# Patient Record
Sex: Male | Born: 1970
Health system: Southern US, Community
[De-identification: ages and names within clinical notes are randomized; demographics above are authoritative.]

## PROBLEM LIST (undated history)

## (undated) DIAGNOSIS — K219 Gastro-esophageal reflux disease without esophagitis: Secondary | ICD-10-CM

## (undated) DIAGNOSIS — R490 Dysphonia: Secondary | ICD-10-CM

## (undated) HISTORY — PX: VASECTOMY: SHX75

## (undated) HISTORY — PX: MOUTH SURGERY: SHX715

---

## 1998-05-23 ENCOUNTER — Emergency Department (HOSPITAL_COMMUNITY): Admission: EM | Admit: 1998-05-23 | Discharge: 1998-05-23 | Payer: Self-pay | Admitting: Emergency Medicine

## 1998-06-17 ENCOUNTER — Emergency Department (HOSPITAL_COMMUNITY): Admission: EM | Admit: 1998-06-17 | Discharge: 1998-06-17 | Payer: Self-pay

## 1998-12-27 ENCOUNTER — Emergency Department (HOSPITAL_COMMUNITY): Admission: EM | Admit: 1998-12-27 | Discharge: 1998-12-27 | Payer: Self-pay | Admitting: *Deleted

## 2000-03-03 ENCOUNTER — Emergency Department (HOSPITAL_COMMUNITY): Admission: EM | Admit: 2000-03-03 | Discharge: 2000-03-03 | Payer: Self-pay

## 2000-08-27 ENCOUNTER — Emergency Department (HOSPITAL_COMMUNITY): Admission: EM | Admit: 2000-08-27 | Discharge: 2000-08-27 | Payer: Self-pay | Admitting: Emergency Medicine

## 2001-05-29 ENCOUNTER — Emergency Department (HOSPITAL_COMMUNITY): Admission: EM | Admit: 2001-05-29 | Discharge: 2001-05-29 | Payer: Self-pay | Admitting: *Deleted

## 2011-03-22 ENCOUNTER — Encounter: Payer: Self-pay | Admitting: *Deleted

## 2011-03-22 ENCOUNTER — Emergency Department (HOSPITAL_COMMUNITY)
Admission: EM | Admit: 2011-03-22 | Discharge: 2011-03-22 | Disposition: A | Discharge status: Home / Self Care | Payer: 59 | Attending: Emergency Medicine | Admitting: Emergency Medicine

## 2011-03-22 DIAGNOSIS — S21109A Unspecified open wound of unspecified front wall of thorax without penetration into thoracic cavity, initial encounter: Secondary | ICD-10-CM | POA: Insufficient documentation

## 2011-03-22 DIAGNOSIS — W268XXA Contact with other sharp object(s), not elsewhere classified, initial encounter: Secondary | ICD-10-CM | POA: Insufficient documentation

## 2011-03-22 DIAGNOSIS — IMO0002 Reserved for concepts with insufficient information to code with codable children: Secondary | ICD-10-CM

## 2011-03-22 MED ORDER — IBUPROFEN 800 MG PO TABS: 800.0000 mg | ORAL_TABLET | Freq: Three times a day (TID) | ORAL | Status: AC

## 2011-03-22 NOTE — ED Notes (Signed)
Pt states box cutter he was using a box cutter to cut a piece of aluminum when the box cutter slipped and cut his chest. Small laceration noted approximately 3 cm. Bleeding under control with guaze aplied

## 2011-03-22 NOTE — ED Notes (Signed)
J Idol PAC at bedside.  Dermabond applied to chest laceration after wound was cleansed with peroxide. Pt tolerated well.

## 2011-03-22 NOTE — ED Notes (Signed)
Pt has lac to chest. Pt states he cut himself with and box knife.

## 2011-03-23 NOTE — ED Provider Notes (Signed)
History     CSN: [id number] Arrival date & time: 03/22/2011  6:54 PM   First MD Initiated Contact with Patient 03/22/11 1915      Chief Complaint  Patient presents with  . Laceration    (Consider location/radiation/quality/duration/timing/severity/associated sxs/prior treatment) Patient is a 40 y.o. male presenting with skin laceration. The history is provided by the patient.  Laceration  The incident occurred 3 to 5 hours ago. The laceration is located on the chest. The laceration is 1 cm in size. Injury mechanism: He was trying to cut a piece of metal with a box cutter,  it slipped,  cutting his chest. The pain is at a severity of 0/10. The patient is experiencing no pain. The pain has been constant since onset. His tetanus status is UTD.    History reviewed. No pertinent past medical history.  History reviewed. No pertinent past surgical history.  History reviewed. No pertinent family history.  History  Substance Use Topics  . Smoking status: Never Smoker   . Smokeless tobacco: Not on file  . Alcohol Use: No      Review of Systems  Constitutional: Negative for fever.  HENT: Negative for congestion, sore throat and neck pain.   Eyes: Negative.   Respiratory: Negative for chest tightness and shortness of breath.   Cardiovascular: Negative for chest pain.  Gastrointestinal: Negative for nausea and abdominal pain.  Genitourinary: Negative.   Musculoskeletal: Negative for joint swelling and arthralgias.  Skin: Positive for wound. Negative for rash.  Neurological: Negative for dizziness, weakness, light-headedness, numbness and headaches.  Hematological: Negative.   Psychiatric/Behavioral: Negative.     Allergies  Review of patient's allergies indicates no known allergies.  Home Medications   Current Outpatient Rx  Name Route Sig Dispense Refill  . IBUPROFEN 800 MG PO TABS Oral Take 1 tablet (800 mg total) by mouth 3 (three) times daily. 21 tablet 0    BP  148/73  Pulse 101  Temp(Src) 98.8 F (37.1 C) (Oral)  Resp 22  Ht 5\' 9"  (1.753 m)  Wt 164 lb (74.39 kg)  BMI 24.22 kg/m2  SpO2 97%  Physical Exam  Nursing note and vitals reviewed. Constitutional: He is oriented to person, place, and time. He appears well-developed and well-nourished.  HENT:  Head: Normocephalic and atraumatic.  Eyes: Conjunctivae are normal.  Neck: Normal range of motion.  Cardiovascular: Normal rate, regular rhythm, normal heart sounds and intact distal pulses.   Pulmonary/Chest: Effort normal and breath sounds normal. He has no wheezes.       1 cm laceration mid sternal area,  Hemostatic.  Abdominal: Soft. Bowel sounds are normal. There is no tenderness.  Musculoskeletal: Normal range of motion.  Neurological: He is alert and oriented to person, place, and time.  Skin: Skin is warm and dry.  Psychiatric: He has a normal mood and affect.    ED Course  Procedures (including critical care time)  Labs Reviewed - No data to display No results found.   1. Laceration    LACERATION REPAIR Performed by: Candis Musa Authorized by: Candis Musa Consent: Verbal consent obtained. Risks and benefits: risks, benefits and alternatives were discussed Consent given by: patient Patient identity confirmed: provided demographic data Prepped and Draped in normal sterile fashion Wound explored  Laceration Location: chest  Laceration Length: 1 cm  No Foreign Bodies seen or palpated  Anesthesia: local infiltration  Local anesthetic: none   Anesthetic total:  na Irrigation method: syringe Amount of cleaning: standard using  peroxide  Skin closure: dermabond  Number of sutures:   Technique:   Patient tolerance: Patient tolerated the procedure well with no immediate complications.    MDM  Near approximated lac,  Hemostatic.        Candis Musa, PA 03/23/11 [phone]

## 2011-03-23 NOTE — ED Provider Notes (Signed)
Medical screening examination/treatment/procedure(s) were performed by non-physician practitioner and as supervising physician I was immediately available for consultation/collaboration.   Benny Lennert, MD 03/23/11 [phone]

## 2012-07-05 ENCOUNTER — Ambulatory Visit (INDEPENDENT_AMBULATORY_CARE_PROVIDER_SITE_OTHER): Payer: 59 | Admitting: Otolaryngology

## 2012-07-05 DIAGNOSIS — R490 Dysphonia: Secondary | ICD-10-CM

## 2012-07-05 DIAGNOSIS — J381 Polyp of vocal cord and larynx: Secondary | ICD-10-CM

## 2012-07-09 ENCOUNTER — Encounter (HOSPITAL_BASED_OUTPATIENT_CLINIC_OR_DEPARTMENT_OTHER): Payer: Self-pay | Admitting: *Deleted

## 2012-07-10 ENCOUNTER — Ambulatory Visit (HOSPITAL_BASED_OUTPATIENT_CLINIC_OR_DEPARTMENT_OTHER)
Admission: RE | Admit: 2012-07-10 | Discharge: 2012-07-10 | Disposition: A | Discharge status: Home / Self Care | Payer: 59 | Source: Ambulatory Visit | Attending: Otolaryngology | Admitting: Otolaryngology

## 2012-07-10 ENCOUNTER — Encounter (HOSPITAL_BASED_OUTPATIENT_CLINIC_OR_DEPARTMENT_OTHER)
Admission: RE | Disposition: A | Discharge status: Home / Self Care | Payer: Self-pay | Source: Ambulatory Visit | Attending: Otolaryngology

## 2012-07-10 ENCOUNTER — Encounter (HOSPITAL_BASED_OUTPATIENT_CLINIC_OR_DEPARTMENT_OTHER): Payer: Self-pay | Admitting: Anesthesiology

## 2012-07-10 ENCOUNTER — Ambulatory Visit (HOSPITAL_BASED_OUTPATIENT_CLINIC_OR_DEPARTMENT_OTHER): Payer: 59 | Admitting: Anesthesiology

## 2012-07-10 DIAGNOSIS — K219 Gastro-esophageal reflux disease without esophagitis: Secondary | ICD-10-CM | POA: Insufficient documentation

## 2012-07-10 DIAGNOSIS — J381 Polyp of vocal cord and larynx: Secondary | ICD-10-CM | POA: Insufficient documentation

## 2012-07-10 DIAGNOSIS — Z8711 Personal history of peptic ulcer disease: Secondary | ICD-10-CM | POA: Insufficient documentation

## 2012-07-10 DIAGNOSIS — Z79899 Other long term (current) drug therapy: Secondary | ICD-10-CM | POA: Insufficient documentation

## 2012-07-10 HISTORY — DX: Dysphonia: R49.0

## 2012-07-10 HISTORY — PX: DIRECT LARYNGOSCOPY: SHX5326

## 2012-07-10 HISTORY — DX: Gastro-esophageal reflux disease without esophagitis: K21.9

## 2012-07-10 LAB — POCT HEMOGLOBIN-HEMACUE: Hemoglobin: 15.2 g/dL (ref 13.0–17.0)

## 2012-07-10 SURGERY — LARYNGOSCOPY, DIRECT
Anesthesia: General | Site: Throat | Laterality: Right | Wound class: Clean Contaminated

## 2012-07-10 MED ORDER — ONDANSETRON HCL 4 MG/2ML IJ SOLN
INTRAMUSCULAR | Status: DC | PRN
  Administered 2012-07-10: 4 mg via INTRAVENOUS

## 2012-07-10 MED ORDER — DEXAMETHASONE SODIUM PHOSPHATE 4 MG/ML IJ SOLN
INTRAMUSCULAR | Status: DC | PRN
  Administered 2012-07-10: 10 mg via INTRAVENOUS

## 2012-07-10 MED ORDER — SUCCINYLCHOLINE CHLORIDE 20 MG/ML IJ SOLN
INTRAMUSCULAR | Status: DC | PRN
  Administered 2012-07-10: 100 mg via INTRAVENOUS

## 2012-07-10 MED ORDER — OXYCODONE HCL 5 MG/5ML PO SOLN: 5.0000 mg | Freq: Once | ORAL | Status: DC | PRN

## 2012-07-10 MED ORDER — FENTANYL CITRATE 0.05 MG/ML IJ SOLN: 50.0000 ug | INTRAMUSCULAR | Status: DC | PRN

## 2012-07-10 MED ORDER — LIDOCAINE HCL (CARDIAC) 20 MG/ML IV SOLN
INTRAVENOUS | Status: DC | PRN
  Administered 2012-07-10: 100 mg via INTRAVENOUS

## 2012-07-10 MED ORDER — FENTANYL CITRATE 0.05 MG/ML IJ SOLN
INTRAMUSCULAR | Status: DC | PRN
  Administered 2012-07-10: 100 ug via INTRAVENOUS

## 2012-07-10 MED ORDER — MIDAZOLAM HCL 5 MG/5ML IJ SOLN
INTRAMUSCULAR | Status: DC | PRN
  Administered 2012-07-10: 2 mg via INTRAVENOUS

## 2012-07-10 MED ORDER — HYDROMORPHONE HCL PF 1 MG/ML IJ SOLN: 0.2500 mg | INTRAMUSCULAR | Status: DC | PRN

## 2012-07-10 MED ORDER — HYDROCODONE-ACETAMINOPHEN 5-325 MG PO TABS: 1.0000 | ORAL_TABLET | ORAL | Status: DC | PRN

## 2012-07-10 MED ORDER — OXYCODONE HCL 5 MG PO TABS: 5.0000 mg | ORAL_TABLET | Freq: Once | ORAL | Status: DC | PRN

## 2012-07-10 MED ORDER — LACTATED RINGERS IV SOLN
INTRAVENOUS | Status: DC
  Administered 2012-07-10: 09:00:00 via INTRAVENOUS

## 2012-07-10 MED ORDER — MIDAZOLAM HCL 2 MG/2ML IJ SOLN: 1.0000 mg | INTRAMUSCULAR | Status: DC | PRN

## 2012-07-10 MED ORDER — PROPOFOL 10 MG/ML IV BOLUS
INTRAVENOUS | Status: DC | PRN
  Administered 2012-07-10: 200 mg via INTRAVENOUS

## 2012-07-10 MED ORDER — EPINEPHRINE HCL 1 MG/ML IJ SOLN
INTRAMUSCULAR | Status: DC | PRN
  Administered 2012-07-10: 1 mg

## 2012-07-10 SURGICAL SUPPLY — 20 items
CANISTER SUCTION 1200CC (MISCELLANEOUS) ×2 IMPLANT
CLOTH BEACON ORANGE TIMEOUT ST (SAFETY) ×2 IMPLANT
GAUZE SPONGE 4X4 12PLY STRL LF (GAUZE/BANDAGES/DRESSINGS) ×2 IMPLANT
GLOVE BIO SURGEON STRL SZ7 (GLOVE) ×2 IMPLANT
GLOVE BIO SURGEON STRL SZ7.5 (GLOVE) ×2 IMPLANT
GOWN PREVENTION PLUS XLARGE (GOWN DISPOSABLE) IMPLANT
GOWN PREVENTION PLUS XXLARGE (GOWN DISPOSABLE) ×2 IMPLANT
GUARD TEETH (MISCELLANEOUS) IMPLANT
MARKER SKIN DUAL TIP RULER LAB (MISCELLANEOUS) IMPLANT
NEEDLE SPNL 22GX7 QUINCKE BK (NEEDLE) IMPLANT
NEEDLE SPNL 25GX3.5 QUINCKE BL (NEEDLE) IMPLANT
NS IRRIG 1000ML POUR BTL (IV SOLUTION) ×2 IMPLANT
PATTIES SURGICAL .5 X3 (DISPOSABLE) ×2 IMPLANT
SHEET MEDIUM DRAPE 40X70 STRL (DRAPES) ×2 IMPLANT
SLEEVE SCD COMPRESS KNEE MED (MISCELLANEOUS) IMPLANT
SOLUTION BUTLER CLEAR DIP (MISCELLANEOUS) ×2 IMPLANT
SYR CONTROL 10ML LL (SYRINGE) IMPLANT
SYR TB 1ML LL NO SAFETY (SYRINGE) IMPLANT
TOWEL OR 17X24 6PK STRL BLUE (TOWEL DISPOSABLE) ×2 IMPLANT
TUBE CONNECTING 20X1/4 (TUBING) ×2 IMPLANT

## 2012-07-10 NOTE — Anesthesia Postprocedure Evaluation (Signed)
  Anesthesia Post-op Note  Patient: Joshua Mathis  Procedure(s) Performed: Procedure(s): DIRECT LARYNGOSCOPY WITH BIOPSY (Right)  Patient Location: PACU  Anesthesia Type:General  Level of Consciousness: awake and alert   Airway and Oxygen Therapy: Patient Spontanous Breathing  Post-op Pain: none  Post-op Assessment: Post-op Vital signs reviewed, Patient's Cardiovascular Status Stable, Respiratory Function Stable, Patent Airway and No signs of Nausea or vomiting  Post-op Vital Signs: Reviewed and stable  Complications: No apparent anesthesia complications

## 2012-07-10 NOTE — Brief Op Note (Signed)
07/10/2012  10:28 AM  PATIENT:  Joshua Mathis  42 y.o. male  PRE-OPERATIVE DIAGNOSIS:  RIGHT FUNGATING LESION OF VOCAL CORD  POST-OPERATIVE DIAGNOSIS:  RIGHT FUNGATING LESION OF VOCAL CORD  PROCEDURE:  Procedure(s): MICRO DIRECT LARYNGOSCOPY WITH EXCISION OF RIGHT VOCAL CORD MASS  SURGEON:  Surgeon(s) and Role:    * Sui W Teoh, MD - Primary  PHYSICIAN ASSISTANT:   ASSISTANTS: none   ANESTHESIA:   general  EBL:  Total I/O In: 500 [I.V.:500] Out: -   BLOOD ADMINISTERED:none  DRAINS: none   LOCAL MEDICATIONS USED:  OTHER Topical epinephrine  SPECIMEN:  Source of Specimen:  Right vocal cord mass  DISPOSITION OF SPECIMEN:  PATHOLOGY  COUNTS:  YES  TOURNIQUET:  * No tourniquets in log *  DICTATION: .Other Dictation: Dictation Number [id number]  PLAN OF CARE: Discharge to home after PACU  PATIENT DISPOSITION:  PACU - hemodynamically stable.   Delay start of Pharmacological VTE agent (>24hrs) due to surgical blood loss or risk of bleeding: not applicable

## 2012-07-10 NOTE — Anesthesia Preprocedure Evaluation (Addendum)
Anesthesia Evaluation  Patient identified by MRN, date of birth, ID band Patient awake    Reviewed: Allergy & Precautions, H&P , NPO status , Patient's Chart, lab work & pertinent test results  Airway Mallampati: II TM Distance: >3 FB Neck ROM: Full    Dental no notable dental hx. (+) Teeth Intact and Dental Advisory Given   Pulmonary neg pulmonary ROS,  breath sounds clear to auscultation  Pulmonary exam normal       Cardiovascular negative cardio ROS  Rhythm:Regular Rate:Normal     Neuro/Psych negative neurological ROS  negative psych ROS   GI/Hepatic Neg liver ROS, GERD-  Medicated,  Endo/Other  negative endocrine ROS  Renal/GU negative Renal ROS  negative genitourinary   Musculoskeletal   Abdominal   Peds  Hematology negative hematology ROS (+)   Anesthesia Other Findings   Reproductive/Obstetrics negative OB ROS                          Anesthesia Physical Anesthesia Plan  ASA: II  Anesthesia Plan: General   Post-op Pain Management:    Induction: Intravenous  Airway Management Planned: Oral ETT  Additional Equipment:   Intra-op Plan:   Post-operative Plan: Extubation in OR  Informed Consent: I have reviewed the patients History and Physical, chart, labs and discussed the procedure including the risks, benefits and alternatives for the proposed anesthesia with the patient or authorized representative who has indicated his/her understanding and acceptance.   Dental advisory given  Plan Discussed with: CRNA  Anesthesia Plan Comments:         Anesthesia Quick Evaluation

## 2012-07-10 NOTE — Anesthesia Procedure Notes (Signed)
Procedure Name: Intubation Date/Time: 07/10/2012 10:02 AM Performed by: Caren Macadam Pre-anesthesia Checklist: Patient identified, Emergency Drugs available, Suction available and Patient being monitored Patient Re-evaluated:Patient Re-evaluated prior to inductionOxygen Delivery Method: Circle System Utilized Preoxygenation: Pre-oxygenation with 100% oxygen Intubation Type: IV induction Ventilation: Mask ventilation without difficulty Laryngoscope Size: Miller and 2 Grade View: Grade I Tube type: Oral Tube size: 6.5 mm Number of attempts: 1 Airway Equipment and Method: stylet and oral airway Placement Confirmation: ETT inserted through vocal cords under direct vision,  positive ETCO2 and breath sounds checked- equal and bilateral Secured at: 24 cm Tube secured with: Tape Dental Injury: Teeth and Oropharynx as per pre-operative assessment

## 2012-07-10 NOTE — H&P (Signed)
  H&P Update  Pt's original H&P dated 07/05/12 reviewed and placed in chart (to be scanned).  I personally examined the patient today.  No change in health. Proceed with micro direct laryngoscopy with biopsy.

## 2012-07-10 NOTE — Transfer of Care (Signed)
Immediate Anesthesia Transfer of Care Note  Patient: Joshua Mathis  Procedure(s) Performed: Procedure(s): DIRECT LARYNGOSCOPY WITH BIOPSY (Right)  Patient Location: PACU  Anesthesia Type:General  Level of Consciousness: awake and alert   Airway & Oxygen Therapy: Patient Spontanous Breathing and Patient connected to face mask oxygen  Post-op Assessment: Report given to PACU RN and Post -op Vital signs reviewed and stable  Post vital signs: Reviewed and stable  Complications: No apparent anesthesia complications

## 2012-07-11 ENCOUNTER — Encounter (HOSPITAL_BASED_OUTPATIENT_CLINIC_OR_DEPARTMENT_OTHER): Payer: Self-pay | Admitting: Otolaryngology

## 2012-07-11 NOTE — Op Note (Signed)
NAMEJERRAN, Joshua Mathis NO.:  [id number]  MEDICAL RECORD NO.:  [id number]  LOCATION:                                 FACILITY:  PHYSICIAN:  Newman Pies, MD                 DATE OF BIRTH:  DATE OF PROCEDURE:  07/10/2012 DATE OF DISCHARGE:                              OPERATIVE REPORT   SURGEON:  Newman Pies, MD  PREOPERATIVE DIAGNOSES: 1. Hoarseness. 2. Right vocal cord mass.  POSTOPERATIVE DIAGNOSES: 1. Hoarseness. 2. Right vocal cord mass.  PROCEDURE PERFORMED:  Microdirect laryngoscopy with excision of right vocal cord mass.  ANESTHESIA:  General endotracheal tube anesthesia.  COMPLICATIONS:  None.  ESTIMATED BLOOD LOSS:  Minimal.  INDICATION FOR PROCEDURE:  The patient is a 42 year old male with a history of hoarseness for several months.  On examination, he was noted to have right anterior vocal cord mass.  Based on the physical exam and laryngoscopy findings, the decision was made for the patient to undergo excision of the right vocal cord mass.  The risks, benefits, alternatives, and details of the procedure were discussed with the patient.  Questions were invited and answered.  Informed consent was obtained.  DESCRIPTION:  The patient was taken to the operating room and placed supine on the operating table.  General endotracheal tube anesthesia was administered by the anesthesiologist.  Preop IV Decadron was given.  The patient was positioned and prepped and draped in a standard fashion for direct laryngoscopy.  Dedo laryngoscope was inserted via the oral cavity into the pharynx.  Examination of the vallecula, epiglottis, and piriform sinuses were all normal.  Examination of the vocal cords revealed 3 mm right anterior vocal cord mass.  Photo documentation of the findings were obtained.  The Dedo laryngoscope was suspended with a Lewy suspender.  Using microscope, the right vocal cord mass was resected with a combination of laryngeal forceps and  laryngeal scissors. The specimen was sent to the Pathology Department for permanent histologic identification.  Hemostasis was achieved with pledgets soaked with epinephrine.  The laryngoscope was withdrawn.  The care of the patient was turned over to the anesthesiologist.  The patient was awakened from anesthesia without difficulty.  He was extubated and transferred to the recovery room in good condition.  OPERATIVE FINDINGS:  A 3-mm right anterior vocal cord mass was noted. It was removed without difficulty.  SPECIMEN:  Right vocal cord mass.  FOLLOWUP CARE:  The patient will be discharged home once he is awake and alert.  He may take Vicodin 1 tablet p.o. q.4 hours p.r.n. pain.  He is also instructed to continue his Nexium treatment regimen.  The patient will follow up in my office in 1 week.     Newman Pies, MD     ST/MEDQ  D:  07/10/2012  T:  07/11/2012  Job:  [id number]  cc:   Catalina Pizza, M.D.

## 2012-07-19 ENCOUNTER — Ambulatory Visit (INDEPENDENT_AMBULATORY_CARE_PROVIDER_SITE_OTHER): Payer: 59 | Admitting: Otolaryngology

## 2012-07-19 DIAGNOSIS — J381 Polyp of vocal cord and larynx: Secondary | ICD-10-CM

## 2012-07-19 DIAGNOSIS — R490 Dysphonia: Secondary | ICD-10-CM

## 2012-10-11 ENCOUNTER — Ambulatory Visit (INDEPENDENT_AMBULATORY_CARE_PROVIDER_SITE_OTHER): Payer: 59 | Admitting: Otolaryngology

## 2012-10-18 ENCOUNTER — Ambulatory Visit (INDEPENDENT_AMBULATORY_CARE_PROVIDER_SITE_OTHER): Payer: 59 | Admitting: Otolaryngology

## 2012-10-18 DIAGNOSIS — R490 Dysphonia: Secondary | ICD-10-CM

## 2012-10-18 DIAGNOSIS — J381 Polyp of vocal cord and larynx: Secondary | ICD-10-CM

## 2013-04-11 ENCOUNTER — Ambulatory Visit (INDEPENDENT_AMBULATORY_CARE_PROVIDER_SITE_OTHER): Payer: 59 | Admitting: Otolaryngology

## 2013-04-11 ENCOUNTER — Encounter (INDEPENDENT_AMBULATORY_CARE_PROVIDER_SITE_OTHER): Payer: Self-pay

## 2013-04-11 DIAGNOSIS — R490 Dysphonia: Secondary | ICD-10-CM

## 2013-04-11 DIAGNOSIS — J381 Polyp of vocal cord and larynx: Secondary | ICD-10-CM

## 2013-10-24 ENCOUNTER — Ambulatory Visit (INDEPENDENT_AMBULATORY_CARE_PROVIDER_SITE_OTHER): Payer: 59 | Admitting: Otolaryngology

## 2013-10-24 DIAGNOSIS — K219 Gastro-esophageal reflux disease without esophagitis: Secondary | ICD-10-CM

## 2013-10-24 DIAGNOSIS — R490 Dysphonia: Secondary | ICD-10-CM

## 2013-10-24 DIAGNOSIS — J381 Polyp of vocal cord and larynx: Secondary | ICD-10-CM

## 2013-11-02 ENCOUNTER — Encounter (HOSPITAL_COMMUNITY): Payer: Self-pay | Admitting: Emergency Medicine

## 2013-11-02 ENCOUNTER — Emergency Department (HOSPITAL_COMMUNITY)
Admission: EM | Admit: 2013-11-02 | Discharge: 2013-11-02 | Disposition: A | Discharge status: Home / Self Care | Payer: 59 | Source: Home / Self Care | Attending: Emergency Medicine | Admitting: Emergency Medicine

## 2013-11-02 DIAGNOSIS — M7652 Patellar tendinitis, left knee: Secondary | ICD-10-CM

## 2013-11-02 DIAGNOSIS — M7650 Patellar tendinitis, unspecified knee: Secondary | ICD-10-CM

## 2013-11-02 MED ORDER — KETOROLAC TROMETHAMINE 60 MG/2ML IM SOLN
60.0000 mg | Freq: Once | INTRAMUSCULAR | Status: AC
  Administered 2013-11-02: 60 mg via INTRAMUSCULAR

## 2013-11-02 MED ORDER — KETOROLAC TROMETHAMINE 60 MG/2ML IM SOLN
INTRAMUSCULAR | Status: AC
  Filled 2013-11-02: qty 2

## 2013-11-02 MED ORDER — INDOMETHACIN 25 MG PO CAPS: 25.0000 mg | ORAL_CAPSULE | Freq: Three times a day (TID) | ORAL | Status: DC

## 2013-11-02 NOTE — ED Provider Notes (Signed)
CSN: [id number]     Arrival date & time 11/02/13  0915 History   First MD Initiated Contact with Patient 11/02/13 [phone]     Chief Complaint  Patient presents with  . 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 States he has had progressive swelling, pain and redness of left anterior knee over past 24 hours. No hx of gout No fever/chills PCP: [place] Margo [place]Hall in [place] States he took ibuprofen yesterday evening with relief.    Patient is a 43 y.o. male presenting with knee pain. The history is provided by the patient.  Knee Pain Location:  Knee Time since incident:  1 day Injury: no   Knee location:  L knee Pain details:    Quality:  Throbbing Chronicity:  New Dislocation: no   Associated symptoms: muscle weakness and swelling   Associated symptoms: no decreased ROM and no fever     Past Medical History  Diagnosis Date  . Hoarseness   . GERD (gastroesophageal reflux disease)    Past Surgical History  Procedure Laterality Date  . Mouth surgery      cyst jaw as teen  . Direct laryngoscopy Right 07/10/2012    Procedure: DIRECT LARYNGOSCOPY WITH BIOPSY;  Surgeon: Darletta MollSui W Teoh, MD;  Location: [facility];  Service: ENT;  Laterality: Right;   No family history on file. History  Substance Use Topics  . Smoking status: Former Smoker    Quit date: 07/10/1998  . Smokeless tobacco: Not on file  . Alcohol Use: No    Review of Systems  Constitutional: Negative for fever.  All other systems reviewed and are negative.   Allergies  Review of patient's allergies indicates no known allergies.  Home Medications   Prior to Admission medications   Medication Sig Start Date End Date Taking? Authorizing Provider   ibuprofen (ADVIL,MOTRIN) 200 MG tablet Take 200 mg by mouth every 6 (six) hours as needed.   Yes Historical Provider, MD  HYDROcodone-acetaminophen (NORCO/VICODIN) 5-325 MG per tablet Take 1 tablet by mouth every 4 (four) hours as needed for pain. 07/10/12   Darletta MollSui W Teoh, MD  indomethacin (INDOCIN) 25 MG capsule Take 1 capsule (25 mg total) by mouth 3 (three) times daily with meals. X 7 days 11/02/13   Jess BartersJennifer Lee Presson, PA  omeprazole (PRILOSEC) 20 MG capsule Take 20 mg by mouth daily.    Historical Provider, MD   BP 109/75  Pulse 57  Temp(Src) 98.1 F (36.7 C) (Oral)  Resp 16  SpO2 98% Physical Exam  Nursing note and vitals reviewed. Constitutional: He is oriented to person, place, and time. He appears well-developed and well-nourished. No distress.  HENT:  Head: Normocephalic and atraumatic.  Eyes: Conjunctivae are normal. No scleral icterus.  Cardiovascular: Normal rate.   Pulmonary/Chest: Effort normal.  Musculoskeletal:       Left knee: He exhibits swelling, effusion and erythema. He exhibits normal range of motion, no ecchymosis, no deformity, no laceration, normal alignment, no LCL laxity, normal patellar mobility and no bony tenderness. Tenderness found. Patellar tendon tenderness noted.  Moderate erythema and tenderness with joint effusion of left anterior knee. +palpable rub or mild crepitance of patellar tendon with palpation   Neurological: He is alert and oriented to person, place, and time.  Skin:  Skin is warm and dry.  Psychiatric: He has a normal mood and affect. His behavior is normal.    ED Course  Procedures (including critical care time) Labs Review Labs Reviewed - No data to display  Imaging Review No results found.   MDM   1. Patellar tendonitis of left knee   I suspect patient has overuse condition or patellar tendonitis as a result of his work. Low clinical suspicion of gout or septic joint. Will advise RICE therapy and indocin as prescribed for next 7  days and follow up if no improvement. Patient given ice pack and Toradol 60mg  [facility]   Jess BartersJennifer Lee WellsPresson, [place] 11/02/13 [address]1016  Jennifer Lee AgraPresson, [place] 11/02/13 1016

## 2013-11-02 NOTE — ED Notes (Signed)
Called script for medication to pharmacy of patients choice[facility].

## 2013-11-02 NOTE — ED Notes (Signed)
Left knee pain, redness, and swelling.  No known injury.  Onset yesterday afternoon

## 2013-11-02 NOTE — Discharge Instructions (Signed)
Patellar Tendinitis, Jumper's Knee with Rehab Tendinitis is inflammation of a tendon. Tendonitis of the tendon below the kneecap (patella) is known as patellar tendonitis. Patellar tendonitis is a common cause of pain below the kneecap (infrapatella). Patellar tendonitis may involve a tear (strain) in the ligament. Strains are classified into three categories. Grade 1 strains cause pain, but the tendon is not lengthened. Grade 2 strains include a lengthened ligament, due to the ligament being stretched or partially ruptured. With grade 2 strains there is still function, although function may be decreased. Grade 3 strains involve a complete tear of the tendon or muscle, and function is usually impaired. Patellar tendon strains are usually grade 1 or 2.  SYMPTOMS   Pain, tenderness, swelling, warmth, or redness over the patellar tendon (just below the kneecap).  Pain and loss of strength (sometimes), with forcefully straightening the knee (especially when jumping or rising from a seated or squatting position), or bending the knee completely (squatting or kneeling).  Crackling sound (crepitation) when the tendon is moved or touched. CAUSES  Patellar tendonitis is caused by injury to the patellar tendon. The inflammation is the body's healing response. Common causes of injury include:  Stress from a sudden increase in intensity, frequency, or duration of training.  Overuse of the quadriceps thigh muscles and patellar tendon.  Direct hit (trauma) to the knee or patellar tendon. RISK INCREASES WITH:  Sports that require sudden, explosive thigh muscle (quadriceps) contraction, such as jumping, quick starts, or kicking.  Running sports, especially running down hills.  Poor strength and flexibility of the thigh and knee.  Flat feet. PREVENTION  Warm up and stretch properly before activity.  Allow for adequate recovery between workouts.  Maintain physical fitness:  Strength, flexibility, and  endurance.  Cardiovascular fitness.  Protect the knee joint with taping, protective strapping, bracing, or elastic compression bandage.  Wear arch supports (orthotics). PROGNOSIS  If treated properly, patellar tendonitis usually heals within 6 weeks.  RELATED COMPLICATIONS   Longer healing time, if not properly treated or if not given enough time to heal.  Recurring symptoms, if activity is resumed too soon, with overuse, with a direct blow, or when using poor technique.  If untreated, tendon rupture requiring surgery. TREATMENT Treatment first involves the use of ice and medicine, to reduce pain and inflammation. The use of strengthening and stretching exercises may help reduce pain with activity. These exercises may be performed at home or with a therapist. Serious cases of tendonitis may require restraining the knee for 10 to 14 days, to prevent stress on the tendon and to promote healing. Crutches may be used (uncommon) until you can walk without a limp. For cases in which non-surgical treatment is unsuccessful, surgery may be advised, to remove the inflamed tendon lining (sheath). Surgery is rare, and is only advised after at least 6 months of non-surgical treatment. MEDICATION   If pain medicine is needed, nonsteroidal anti-inflammatory medicines (aspirin and ibuprofen), or other minor pain relievers (acetaminophen), are often advised.  Do not take pain medicine for 7 days before surgery.  Prescription pain relievers may be given, if your caregiver thinks they are needed. Use only as directed and only as much as you need. HEAT AND COLD  Cold treatment (icing) should be applied for 10 to 15 minutes every 2 to 3 hours for inflammation and pain, and immediately after activity that aggravates your symptoms. Use ice packs or an ice massage.  Heat treatment may be used before performing stretching and  strengthening activities prescribed by your caregiver, physical therapist, or athletic  trainer. Use a heat pack or a warm water soak. SEEK MEDICAL CARE IF:  Symptoms get worse or do not improve in 2 weeks, despite treatment.  New, unexplained symptoms develop. (Drugs used in treatment may produce side effects.) EXERCISES RANGE OF MOTION (ROM) AND STRETCHING EXERCISES - Patellar Tendinitis (Jumper's Knee) These are some of the initial exercises with which you may start your rehabilitation program, until you see your caregiver again or until your symptoms are resolved. Remember:   Flexible tissue is more tolerant of the stresses placed on it during activities.  Each stretch should be held for 20 to 30 seconds.  A gentle stretching sensation should be felt. STRETCH - Hamstrings, Supine  Lie on your back. Loop a belt or towel over the ball of your right / left foot.  Straighten your right / left knee and slowly pull on the belt to raise your leg. Do not allow the right / left knee to bend. Keep your opposite leg flat on the floor.  Raise the leg until you feel a gentle stretch behind your right / left knee or thigh. Hold this position for __________ seconds. Repeat __________ times. Complete this stretch __________ times per day.  STRETCH - Hamstrings, Doorway  Lie on your back with your right / left leg extended and resting on the wall, and the opposite leg flat on the ground through the door. At first, position your bottom farther away from the wall.  Keep your right / left knee straight. If you feel a stretch behind your knee or thigh, hold this position for __________ seconds.  If you do not feel a stretch, scoot your bottom closer to the door, and hold __________ seconds. Repeat __________ times. Complete this stretch __________ times per day.  STRETCH - Hamstrings, Standing  Stand or sit and extend your right / left leg, placing your foot on a chair or foot stool.  Keep a slight arch in your low back and your hips straight forward.  Lead with your chest and lean  forward at the waist until you feel a gentle stretch in the back of your right / left knee or thigh. (When done correctly, this exercise requires leaning only a small distance.)  Hold this position for __________ seconds. Repeat __________ times. Complete this stretch __________ times per day. STRETCH - Adductors, Lunge  While standing, spread your legs, with your right / left leg behind you.  Lean away from your right / left leg by bending your opposite knee. You may rest your hands on your thigh for balance.  You should feel a stretch in your right / left inner thigh. Hold for __________ seconds. Repeat __________ times. Complete this exercise __________ times per day.  STRENGTHENING EXERCISES - Patellar Tendinitis (Jumper's Knee) These exercises may help you when beginning to rehabilitate your injury. They may resolve your symptoms with or without further involvement from your physician, physical therapist or athletic trainer. While completing these exercises, remember:   Muscles can gain both the endurance and the strength needed for everyday activities through controlled exercises.  Complete these exercises as instructed by your physician, physical therapist or athletic trainer. Increase the resistance and repetitions only as guided by your caregiver. STRENGTH - Quadriceps, Isometrics  Lie on your back with your right / left leg extended and your opposite knee bent.  Gradually tense the muscles in the front of your right / left thigh. You  should see either your kneecap slide up toward your hip or increased dimpling just above the knee. This motion will push the back of the knee down toward the floor, mat, or bed on which you are lying.  Hold the muscle as tight as you can, without increasing your pain, for __________ seconds.  Relax the muscles slowly and completely in between each repetition. Repeat __________ times. Complete this exercise __________ times per day.  STRENGTH -  Quadriceps, Short Arcs  Lie on your back. Place a __________ inch towel roll under your right / left knee, so that the knee bends slightly.  Raise only your lower leg by tightening the muscles in the front of your thigh. Do not allow your thigh to rise.  Hold this position for __________ seconds. Repeat __________ times. Complete this exercise __________ times per day.  OPTIONAL ANKLE WEIGHTS: Begin with ____________________, but DO NOT exceed ____________________. Increase in 1 pound/ 0.5 kilogram increments. STRENGTH - Quadriceps, Straight Leg Raises  Quality counts! Watch for signs that the quadriceps muscle is working, to be sure you are strengthening the correct muscles and not "cheating" by substituting with healthier muscles.  Lay on your back with your right / left leg extended and your opposite knee bent.  Tense the muscles in the front of your right / left thigh. You should see either your kneecap slide up or increased dimpling just above the knee. Your thigh may even shake a bit.  Tighten these muscles even more and raise your leg 4 to 6 inches off the floor. Hold for __________ seconds.  Keeping these muscles tense, lower your leg.  Relax the muscles slowly and completely between each repetition. Repeat __________ times. Complete this exercise __________ times per day.  STRENGTH - Quadriceps, Squats  Stand in a door frame so that your feet and knees are in line with the frame.  Use your hands for balance, not support, on the frame.  Slowly lower your weight, bending at the hips and knees. Keep your lower legs upright so that they are parallel with the door frame. Squat only within the range that does not increase your knee pain. Never let your hips drop below your knees.  Slowly return upright, pushing with your legs, not pulling with your hands. Repeat __________ times. Complete this exercise __________ times per day.  STRENGTH - Quadriceps, Step-Downs  Stand on the  edge of a step stool or stair. Be prepared to use a countertop or wall for balance, if needed.  Keeping your right / left knee directly over the middle of your foot, slowly touch your opposite heel to the floor or lower step. Do not go all the way to the floor if your knee pain increases, just go as far as you can without increased discomfort. Use your right / left leg muscles, not gravity to lower your body weight.  Slowly push your body weight back up to the starting position, Repeat __________ times. Complete this exercise __________ times per day.  Document Released: 04/25/2005 Document Revised: 07/18/2011 Document Reviewed: 08/07/2008 [facility] Patient Information 2015 [place], [place]. This information is not intended to replace advice given to you by your health care provider. Make sure you discuss any questions you have with your health care provider.  Tendinitis Tendinitis is swelling and inflammation of the tendons. Tendons are band-like tissues that connect muscle to bone. Tendinitis commonly occurs in the:   Shoulders (rotator cuff).  Heels (Achilles tendon).  Elbows (triceps tendon). CAUSES Tendinitis  is usually caused by overusing the tendon, muscles, and joints involved. When the tissue surrounding a tendon (synovium) becomes inflamed, it is called tenosynovitis. Tendinitis commonly develops in people whose jobs require repetitive motions. SYMPTOMS  Pain.  Tenderness.  Mild swelling. DIAGNOSIS Tendinitis is usually diagnosed by physical exam. Your health care provider may also order X-rays or other imaging tests. TREATMENT Your health care provider may recommend certain medicines or exercises for your treatment. HOME CARE INSTRUCTIONS   Use a sling or splint for as long as directed by your health care provider until the pain decreases.  Put ice on the injured area.  Put ice in a plastic bag.  Place a towel between your skin and the bag.  Leave the ice on for 15-20  minutes, 3-4 times a day, or as directed by your health care provider.  Avoid using the limb while the tendon is painful. Perform gentle range of motion exercises only as directed by your health care provider. Stop exercises if pain or discomfort increase, unless directed otherwise by your health care provider.  Only take over-the-counter or prescription medicines for pain, discomfort, or fever as directed by your health care provider. SEEK MEDICAL CARE IF:   Your pain and swelling increase.  You develop new, unexplained symptoms, especially increased numbness in the hands. MAKE SURE YOU:   Understand these instructions.  Will watch your condition.  Will get help right away if you are not doing well or get worse. Document Released: 04/22/2000 Document Revised: 04/30/2013 Document Reviewed: 07/12/2010 [facility] Patient Information 2015 [place], [place]. This information is not intended to replace advice given to you by your health care provider. Make sure you discuss any questions you have with your health care provider.

## 2013-11-05 NOTE — ED Provider Notes (Signed)
Medical screening examination/treatment/procedure(s) were performed by non-physician practitioner and as supervising physician I was immediately available for consultation/collaboration.  David Keller, M.D.  David C Keller, MD 11/05/13 1633 

## 2014-05-23 ENCOUNTER — Emergency Department (HOSPITAL_COMMUNITY)
Admission: EM | Admit: 2014-05-23 | Discharge: 2014-05-23 | Discharge status: Against Medical Advice | Payer: 59 | Attending: Emergency Medicine | Admitting: Emergency Medicine

## 2014-05-23 ENCOUNTER — Encounter (HOSPITAL_COMMUNITY): Payer: Self-pay

## 2014-05-23 DIAGNOSIS — R109 Unspecified abdominal pain: Secondary | ICD-10-CM | POA: Insufficient documentation

## 2014-05-23 NOTE — ED Notes (Signed)
Pt got back to the treatment area and ran into the bathroom.  Pt states he had a large BM and is now feeling better.  Pt signed out AMA feeling that he did not need to be seen.

## 2014-05-23 NOTE — ED Notes (Signed)
Pt awoke with severe lower abd pain approx 1 hour ago, has not vomited or had diarrhea.  Pt is doubled over in pain

## 2014-05-23 NOTE — ED Provider Notes (Signed)
Patient left before evaluation, because his pain resolved.  Joshua MelterElliott L Kimberely Mccannon, MD 05/23/14 [phone]

## 2014-10-30 ENCOUNTER — Ambulatory Visit (INDEPENDENT_AMBULATORY_CARE_PROVIDER_SITE_OTHER): Payer: 59 | Admitting: Otolaryngology

## 2014-10-30 DIAGNOSIS — K219 Gastro-esophageal reflux disease without esophagitis: Secondary | ICD-10-CM | POA: Diagnosis not present

## 2014-10-30 DIAGNOSIS — R490 Dysphonia: Secondary | ICD-10-CM | POA: Diagnosis not present

## 2014-11-26 ENCOUNTER — Emergency Department (INDEPENDENT_AMBULATORY_CARE_PROVIDER_SITE_OTHER)
Admission: EM | Admit: 2014-11-26 | Discharge: 2014-11-26 | Disposition: A | Discharge status: Home / Self Care | Payer: 59 | Source: Home / Self Care | Attending: Family Medicine | Admitting: Family Medicine

## 2014-11-26 ENCOUNTER — Encounter: Payer: Self-pay | Admitting: *Deleted

## 2014-11-26 DIAGNOSIS — M7051 Other bursitis of knee, right knee: Secondary | ICD-10-CM | POA: Diagnosis not present

## 2014-11-26 DIAGNOSIS — L03115 Cellulitis of right lower limb: Secondary | ICD-10-CM | POA: Diagnosis not present

## 2014-11-26 DIAGNOSIS — S80211A Abrasion, right knee, initial encounter: Secondary | ICD-10-CM | POA: Diagnosis not present

## 2014-11-26 MED ORDER — IBUPROFEN 600 MG PO TABS: 600.0000 mg | ORAL_TABLET | Freq: Four times a day (QID) | ORAL | Status: AC | PRN

## 2014-11-26 MED ORDER — HYDROCODONE-ACETAMINOPHEN 5-325 MG PO TABS: 1.0000 | ORAL_TABLET | Freq: Four times a day (QID) | ORAL | Status: AC | PRN

## 2014-11-26 MED ORDER — KETOROLAC TROMETHAMINE 60 MG/2ML IM SOLN
60.0000 mg | Freq: Once | INTRAMUSCULAR | Status: AC
  Administered 2014-11-26: 60 mg via INTRAMUSCULAR

## 2014-11-26 MED ORDER — DOXYCYCLINE HYCLATE 100 MG PO CAPS: 100.0000 mg | ORAL_CAPSULE | Freq: Two times a day (BID) | ORAL | Status: AC

## 2014-11-26 NOTE — ED Notes (Signed)
Pt c/o 2-3 days of right knee pain. No injury. Today developed redness, warmth and swelling. Afebrile. 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

## 2014-11-26 NOTE — Discharge Instructions (Signed)
Norco/vicodin (hydrocodone-acetaminophen) is a narcotic pain medication, do not combine these medications with others containing tylenol. While taking, do not drink alcohol, drive, or perform any other activities that requires focus while taking these medications.  Be sure to keep area cleaned with soap and water and keep covered with a bandage to help keep protected.  Try to avoid kneeling for at least 1 week to allow injury and wound to heal.  Please be sure to take antibiotics as prescribed. You may keep area elevated and use an ace wrap for light compression to help with pain and swelling. If you develop worsening pain, redness, swelling, fever, unable to bend your knee or other new concerning symptoms develop, seek medical attention immediately as you may need strong IV antibiotics.  See below for further instructions.

## 2014-11-26 NOTE — ED Provider Notes (Signed)
CSN: [id number]     Arrival date & time 11/26/14  1752 History   First MD Initiated Contact with Patient 11/26/14 1754     Chief Complaint  Patient presents with  . Knee Pain   (Consider location/radiation/quality/duration/timing/severity/associated sxs/prior Treatment) HPI  Patient is a 44 year old male presenting to urgent care with complaints of gradually worsening right knee pain for the last 2-3 days.  Patient reports he developed redness, warmth, swelling to the lower part of his right knee.  Pain is 7/10 at worst.  Worse with ambulation and palpation.  Pain is constant, aching, throbbing and nagging.  He has tried ibuprofen with minimal relief.  Reports history of similar symptoms last year and was diagnosed with tendinitis.  States he was given a shot and antibiotics at that time and symptoms resolved.  He did not follow-up with an orthopedist.  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  States he does not wear knee pads during these projects. Denies fever, chills, n/v/d. Denies recent falls or twisting of his knee.  Past Medical History  Diagnosis Date  . Hoarseness   . GERD (gastroesophageal reflux disease)    Past Surgical History  Procedure Laterality Date  . Mouth surgery      cyst jaw as teen  . Direct laryngoscopy Right 07/10/2012    Procedure: DIRECT LARYNGOSCOPY WITH BIOPSY;  Surgeon: Darletta Moll, MD;  Location: [facility];  Service: ENT;  Laterality: Right;  . Vasectomy     History reviewed. No pertinent family history. History  Substance Use Topics  . Smoking status: Former Smoker    Quit date: 07/10/1998  . Smokeless tobacco: Never Used  . Alcohol Use: No    Review of Systems  Constitutional: Negative for fever and chills.  Musculoskeletal: Positive for myalgias, joint swelling, arthralgias and gait problem. Negative for back pain, neck pain and neck stiffness.       Right knee pain  and swelling  Skin: Positive for color change and wound. Negative for pallor and rash.  Neurological: Negative for weakness and numbness.    Allergies  Review of patient's allergies indicates no known allergies.  Home Medications   Prior to Admission medications   Medication Sig Start Date End Date Taking? Authorizing Provider  doxycycline (VIBRAMYCIN) 100 MG capsule Take 1 capsule (100 mg total) by mouth 2 (two) times daily. One po bid x 7 days 11/26/14   Junius Finner, PA-C  HYDROcodone-acetaminophen (NORCO/VICODIN) 5-325 MG per tablet Take 1-2 tablets by mouth every 6 (six) hours as needed. 11/26/14   Junius Finner, PA-C  ibuprofen (ADVIL,MOTRIN) 600 MG tablet Take 1 tablet (600 mg total) by mouth every 6 (six) hours as needed. 11/26/14   Junius Finner, PA-C  omeprazole (PRILOSEC) 20 MG capsule Take 20 mg by mouth daily.    Historical Provider, MD   BP 132/88 mmHg  Pulse 87  Temp(Src) 99.4 F (37.4 C) (Oral)  Resp 14  Ht  (1.88 m)  Wt 170 lb (77.111 kg)  BMI 21.82 kg/m2  SpO2 99% Physical Exam  Constitutional: He is oriented to person, place, and time. He appears well-developed and well-nourished.  HENT:  Head: Normocephalic and atraumatic.  Eyes: EOM are normal.  Neck: Normal range of motion.  Cardiovascular: Normal rate.   Pulmonary/Chest: Effort normal.  Musculoskeletal: Normal range of motion. He exhibits edema and tenderness.  Right knee: moderate edema with erythema  and tenderness over tibial tuberosity. Mild crepitus of knee with full extension. Muscle compartments are soft, non-tender. Left knee: mild edema over tibial tuberosity, non-tender.  Neurological: He is alert and oriented to person, place, and time.  Skin: Skin is warm and dry. There is erythema.  Right knee: small abrasion over tibial tuberosity. No bleeding or discharge.   Psychiatric: He has a normal mood and affect. His behavior is normal.  Nursing note and vitals reviewed.   ED Course   Procedures (including critical care time) Labs Review Labs Reviewed - No data to display  Imaging Review No results found.   MDM   1. Infrapatellar bursitis, right   2. Cellulitis of right knee   3. Abrasion of right knee, initial encounter     Hx and exam c/w infrapatella bursitis of Right knee and early onset cellulitis with abrasion and mild erythema with warmth over tibial tuberosity. No knee effusion or joint space tenderness. Doubt septic joint at this time.  Wound was cleaned by RN and wrapped with ace bandage in urgent care. IM toradol 60mg  IM   Discussed home care instructions including proper wound care and elevating Right knee. encouraged pt to stay off knee for at least a week to allow it to heal.  Rx: doxycycline, vicodin (6 tabs), and ibuprofen. F/u with PCP in 3-4 days for recheck if not improving.  Discussed symptoms that warrant emergent care in the ED. Return precautions provided. Pt verbalized understanding and agreement with tx plan.     Junius FinnerErin O'Malley, PA-C 11/26/14 [phone]

## 2014-11-27 ENCOUNTER — Other Ambulatory Visit: Payer: Self-pay | Admitting: Obstetrics & Gynecology

## 2014-11-27 MED ORDER — SILVER SULFADIAZINE 1 % EX CREA: TOPICAL_CREAM | CUTANEOUS | Status: AC

## 2014-11-27 MED ORDER — SULFAMETHOXAZOLE-TRIMETHOPRIM 800-160 MG PO TABS: 1.0000 | ORAL_TABLET | Freq: Two times a day (BID) | ORAL | Status: AC

## 2014-11-27 NOTE — Telephone Encounter (Signed)
Pt with 2 day history of a pimple/boil on right patellar tendon area, now with cellulitis and dependent inflammatory debris swelling  Certainly concerning MRSA  On doxycycline but remembers that he had some resistance to the doxycycline 10 years ago or so  Add bactrim to add then alternate, local care with silvadene, french green clay, compression elevation  Will take a few days to see marked improvement  If worsens seek further treatment

## 2015-10-26 ENCOUNTER — Ambulatory Visit (INDEPENDENT_AMBULATORY_CARE_PROVIDER_SITE_OTHER): Payer: 59 | Admitting: Otolaryngology

## 2015-10-26 DIAGNOSIS — J0101 Acute recurrent maxillary sinusitis: Secondary | ICD-10-CM

## 2015-10-26 DIAGNOSIS — J343 Hypertrophy of nasal turbinates: Secondary | ICD-10-CM

## 2015-10-26 DIAGNOSIS — R490 Dysphonia: Secondary | ICD-10-CM | POA: Diagnosis not present

## 2015-10-26 DIAGNOSIS — J381 Polyp of vocal cord and larynx: Secondary | ICD-10-CM | POA: Diagnosis not present

## 2017-07-10 ENCOUNTER — Ambulatory Visit (HOSPITAL_COMMUNITY)
Admission: RE | Admit: 2017-07-10 | Discharge: 2017-07-10 | Disposition: A | Discharge status: Home / Self Care | Payer: 59 | Source: Ambulatory Visit | Attending: Internal Medicine | Admitting: Internal Medicine

## 2017-07-10 ENCOUNTER — Other Ambulatory Visit (HOSPITAL_COMMUNITY): Payer: Self-pay | Admitting: Adult Health Nurse Practitioner

## 2017-07-10 ENCOUNTER — Other Ambulatory Visit (HOSPITAL_COMMUNITY): Payer: Self-pay | Admitting: Internal Medicine

## 2017-07-10 DIAGNOSIS — R109 Unspecified abdominal pain: Secondary | ICD-10-CM

## 2017-07-17 ENCOUNTER — Encounter: Payer: Self-pay | Admitting: Gastroenterology

## 2017-09-08 ENCOUNTER — Ambulatory Visit: Payer: 59 | Admitting: Gastroenterology

## 2017-12-04 ENCOUNTER — Ambulatory Visit: Payer: 59 | Admitting: Gastroenterology

## 2019-05-01 IMAGING — US US ABDOMEN COMPLETE
1 series · 14 of 25 positions shown · non-contrast
Comparison: None.

CLINICAL DATA: Abdominal pain.

EXAM:
ABDOMEN ULTRASOUND COMPLETE

[Series 1: us abdomen complete · 0.19mm/px · 14 of 92 slices shown]
[im 1/92]
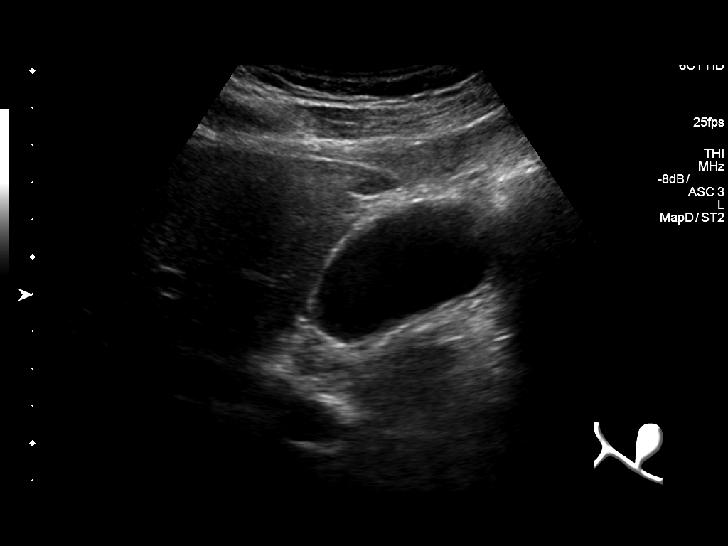
[im 8/92]
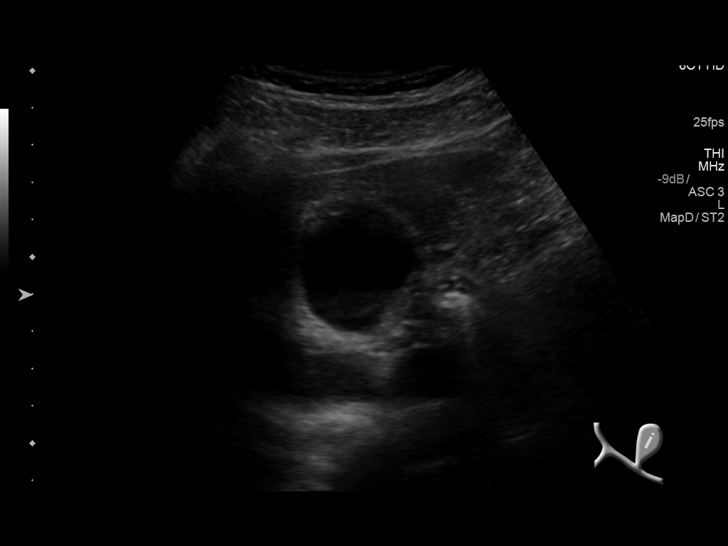
[im 16/92]
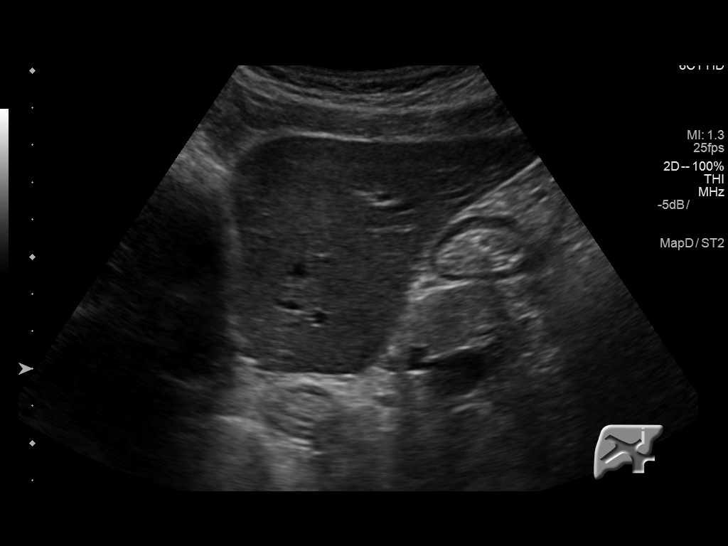
[im 23/92]
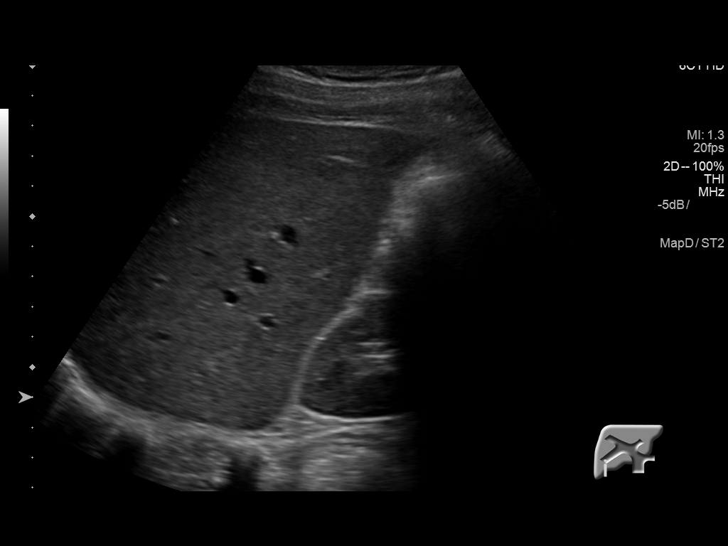
[im 31/92]
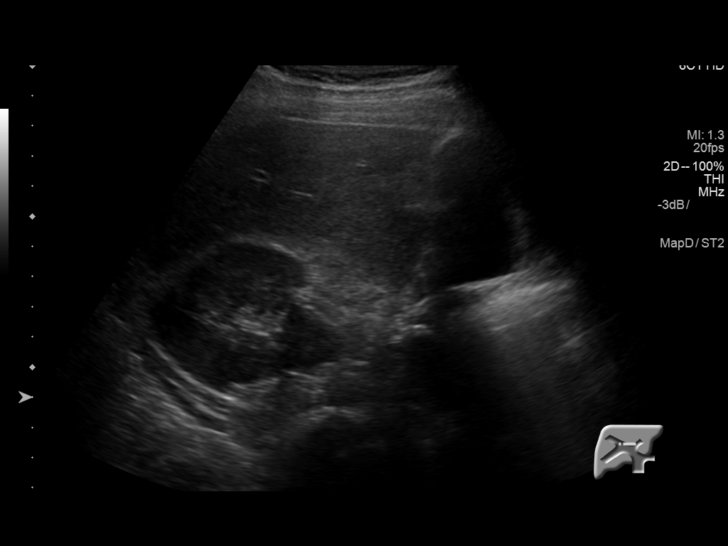
[im 35/92]
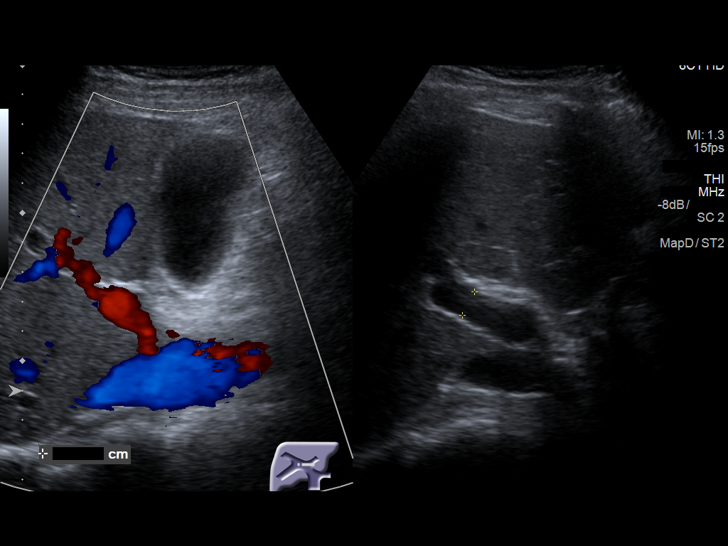
[im 42/92]
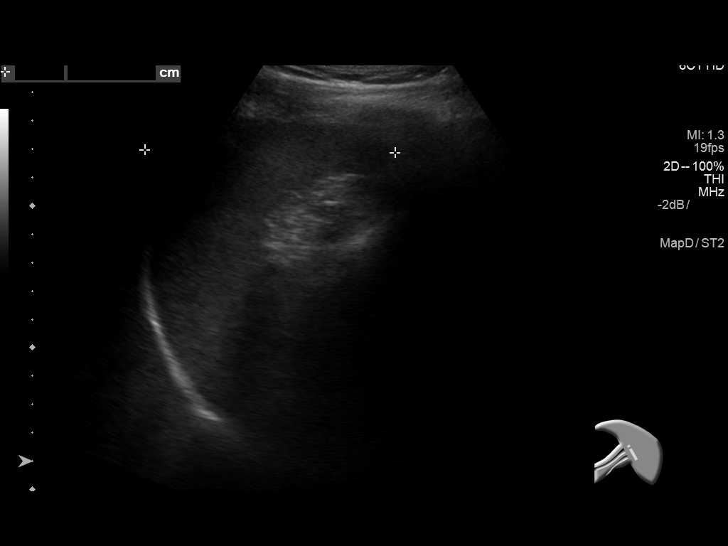
[im 50/92]
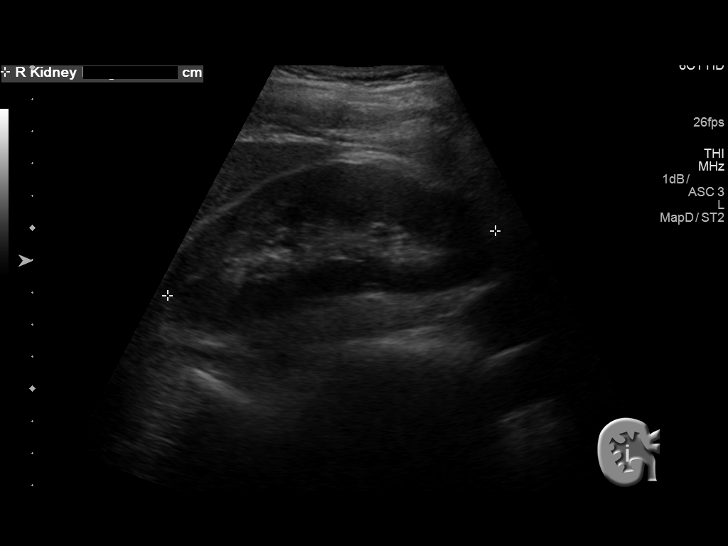
[im 57/92]
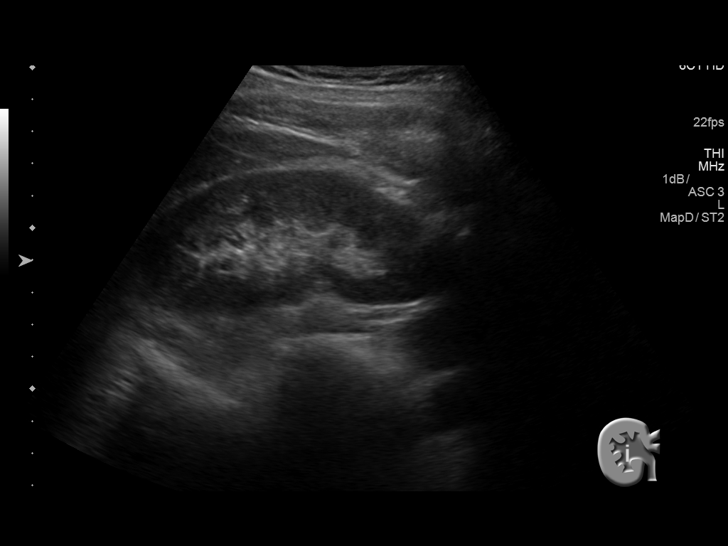
[im 61/92]
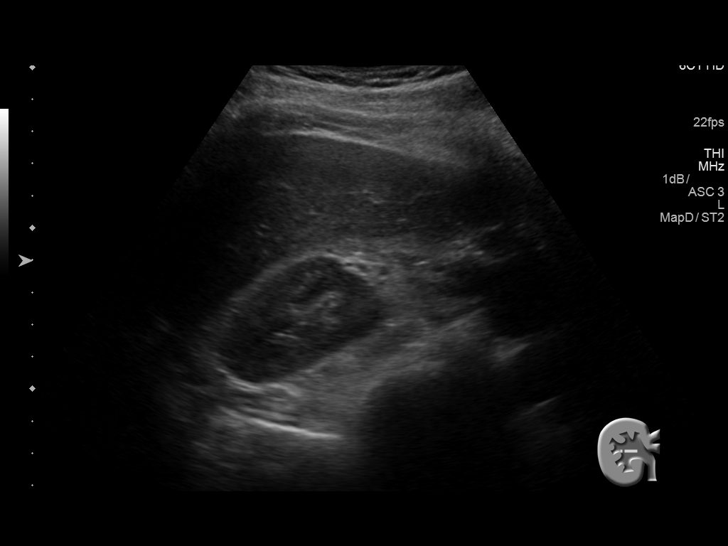
[im 69/92]
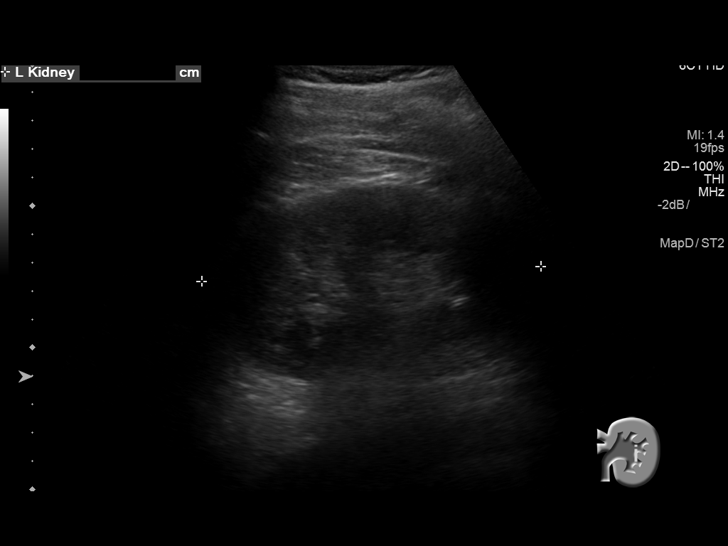
[im 76/92]
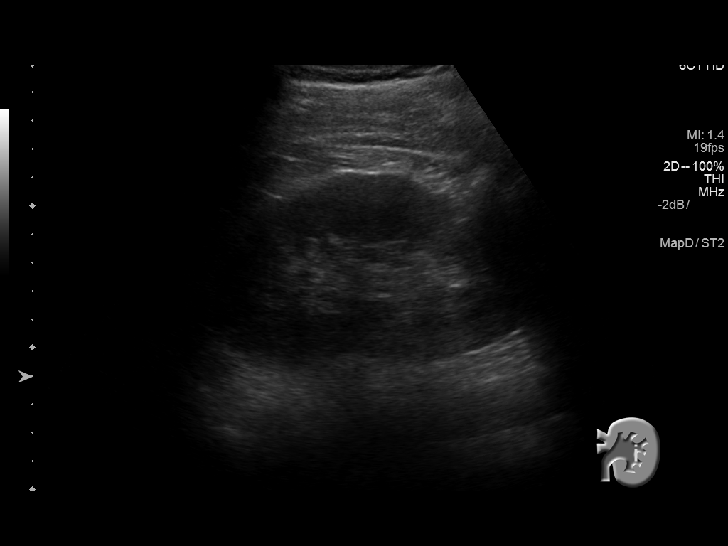
[im 84/92]
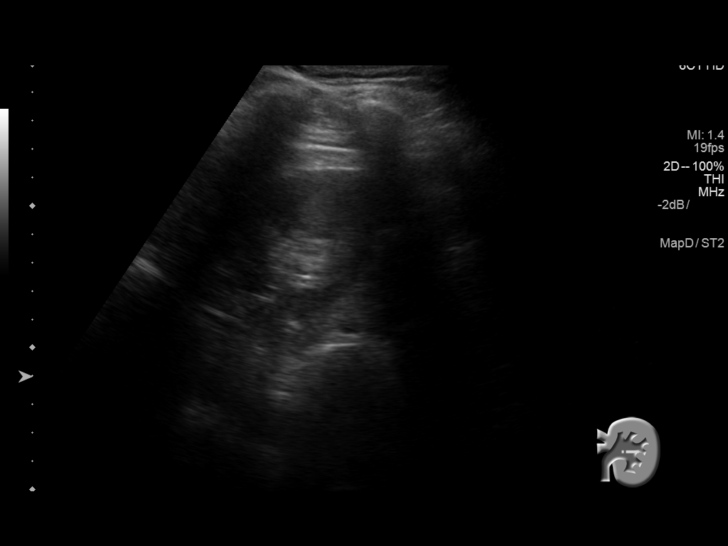
[im 92/92]
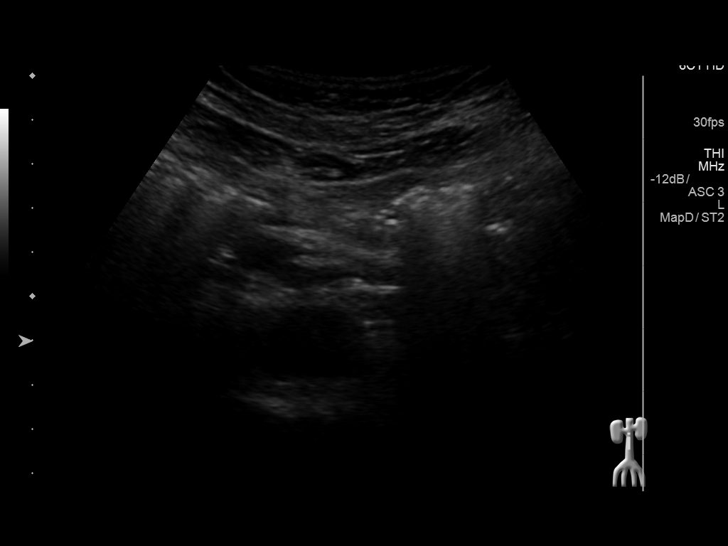

[14 of 25 positions shown; findings below may reference images not displayed]

FINDINGS: Gallbladder: No gallstones or wall thickening visualized. No
sonographic Murphy sign noted by sonographer.

Common bile duct: Diameter: 3 mm

Liver: No focal lesion identified. Within normal limits in
parenchymal echogenicity. Portal vein is patent on color Doppler
imaging with normal direction of blood flow towards the liver.

IVC: No abnormality visualized.

Pancreas: Visualized portion unremarkable.

Spleen: Size and appearance within normal limits.

Right Kidney: Length: 10.6 cm. Echogenicity within normal limits. No
mass or hydronephrosis visualized.

Left Kidney: Length: 11.6 cm. Echogenicity within normal limits. No
mass or hydronephrosis visualized.

Abdominal aorta: No aneurysm visualized.

Other findings: None.
IMPRESSION: 1. Normal abdominal ultrasound.
2. No cholelithiasis or sonographic evidence of acute cholecystitis.

## 2022-07-18 ENCOUNTER — Ambulatory Visit
Admission: EM | Admit: 2022-07-18 | Discharge: 2022-07-18 | Disposition: A | Discharge status: Home / Self Care | Payer: 59 | Attending: Family Medicine | Admitting: Family Medicine

## 2022-07-18 DIAGNOSIS — Z23 Encounter for immunization: Secondary | ICD-10-CM

## 2022-07-18 DIAGNOSIS — S61211A Laceration without foreign body of left index finger without damage to nail, initial encounter: Secondary | ICD-10-CM | POA: Diagnosis not present

## 2022-07-18 MED ORDER — TETANUS-DIPHTH-ACELL PERTUSSIS 5-2.5-18.5 LF-MCG/0.5 IM SUSY
0.5000 mL | PREFILLED_SYRINGE | Freq: Once | INTRAMUSCULAR | Status: AC
  Administered 2022-07-18: 0.5 mL via INTRAMUSCULAR

## 2022-07-18 NOTE — ED Triage Notes (Signed)
Pt states he was cutting a drop cord with razor knife and cut his pointer finger on left hand today.

## 2022-07-20 NOTE — ED Provider Notes (Signed)
  [name]   [id number] 07/18/22 Arrival Time: 1941  ASSESSMENT & PLAN:  1. Laceration of left index finger without foreign body without damage to nail, initial encounter    Meds ordered this encounter  Medications   Tdap (BOOSTRIX) injection 0.5 mL   Procedure: Laceration Repair Verbal consent obtained. Patient provided with risks and alternatives to the procedure. Wound copiously irrigated with NS then cleansed with betadine. Local anesthesia: Lidocaine 1% without epinephrine. Wound carefully explored. No foreign body, tendon injury, or nonviable tissue were noted. Using sterile technique, 3 interrupted 4-0 Ethilon sutures were placed to reapproximate the wound. Procedure tolerated well. No complications. Minimal bleeding. Advised to look for and return for any signs of infection such as redness, swelling, discharge, or worsening pain. Return for suture removal in 7 days.  OTC ibuprofen if needed for pain.  Reviewed expectations re: course of current medical issues. Questions answered. Outlined signs and symptoms indicating need for more acute intervention. Patient verbalized understanding. After Visit Summary given.   SUBJECTIVE:  Joshua Mathis is a 52 y.o. male who presents with a laceration of his left 2nd finger; cut on razor knife 1-2 h ago. Still bleeding. Some pain. No extremity sensation changes or weakness.   Td UTD: No.   OBJECTIVE:  Vitals:   07/18/22 1946  BP: 128/86  Pulse: 79  Resp: 17  Temp: 98.5 F (36.9 C)  TempSrc: Oral  SpO2: 97%     General appearance: alert; no distress LUE: slightly curved laceration of side of his LEFT 2nd mid-finger; size: approx 1 cm; clean wound edges, no foreign bodies; with active bleeding; finger with FROM, normal cap refill and distal sensation Psychological: alert and cooperative; normal mood and affect    Labs Reviewed - No data to display  No results found.  No Known Allergies  Past Medical  History:  Diagnosis Date   GERD (gastroesophageal reflux disease)    Hoarseness    Social History   Socioeconomic History   Marital status: Married    Spouse name: Not on file   Number of children: Not on file   Years of education: Not on file   Highest education level: Not on file  Occupational History   Not on file  Tobacco Use   Smoking status: Former    Types: Cigarettes    Quit date: 07/10/1998    Years since quitting: 24.0   Smokeless tobacco: Never  Substance and Sexual Activity   Alcohol use: No   Drug use: No   Sexual activity: Not on file  Other Topics Concern   Not on file  Social History Narrative   Not on file   Social Determinants of Health   Financial Resource Strain: Not on file  Food Insecurity: Not on file  Transportation Needs: Not on file  Physical Activity: Not on file  Stress: Not on file  Social Connections: Not on file          Joshua Kick, MD 07/20/22 1520
